# Patient Record
Sex: Female | Born: 1990 | Race: Black or African American | Hispanic: No | Marital: Single | Smoking: Never smoker
Health system: Southern US, Community
[De-identification: ages and names within clinical notes are randomized; demographics above are authoritative.]

## PROBLEM LIST (undated history)

## (undated) ENCOUNTER — Inpatient Hospital Stay (HOSPITAL_COMMUNITY): Payer: Self-pay

## (undated) DIAGNOSIS — Z789 Other specified health status: Secondary | ICD-10-CM

## (undated) DIAGNOSIS — B009 Herpesviral infection, unspecified: Secondary | ICD-10-CM

## (undated) HISTORY — PX: NO PAST SURGERIES: SHX2092

---
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
## 2011-05-10 ENCOUNTER — Emergency Department (HOSPITAL_COMMUNITY): Payer: Medicaid Other

## 2011-05-10 ENCOUNTER — Emergency Department (HOSPITAL_COMMUNITY)
Admission: EM | Admit: 2011-05-10 | Discharge: 2011-05-10 | Disposition: A | Discharge status: Home / Self Care | Payer: Medicaid Other | Attending: Emergency Medicine | Admitting: Emergency Medicine

## 2011-05-10 DIAGNOSIS — R51 Headache: Secondary | ICD-10-CM | POA: Insufficient documentation

## 2011-05-10 DIAGNOSIS — S022XXA Fracture of nasal bones, initial encounter for closed fracture: Secondary | ICD-10-CM | POA: Insufficient documentation

## 2011-05-10 DIAGNOSIS — R220 Localized swelling, mass and lump, head: Secondary | ICD-10-CM | POA: Insufficient documentation

## 2011-12-09 ENCOUNTER — Encounter: Payer: Self-pay | Admitting: Women's Health

## 2011-12-09 ENCOUNTER — Ambulatory Visit (INDEPENDENT_AMBULATORY_CARE_PROVIDER_SITE_OTHER): Payer: BC Managed Care – PPO | Admitting: Women's Health

## 2011-12-09 VITALS — BP 110/72 | Ht 61.5 in | Wt 159.0 lb

## 2011-12-09 DIAGNOSIS — Z01419 Encounter for gynecological examination (general) (routine) without abnormal findings: Secondary | ICD-10-CM

## 2011-12-09 DIAGNOSIS — Z113 Encounter for screening for infections with a predominantly sexual mode of transmission: Secondary | ICD-10-CM

## 2011-12-09 DIAGNOSIS — Z309 Encounter for contraceptive management, unspecified: Secondary | ICD-10-CM

## 2011-12-09 DIAGNOSIS — IMO0001 Reserved for inherently not codable concepts without codable children: Secondary | ICD-10-CM

## 2011-12-09 LAB — CBC WITH DIFFERENTIAL/PLATELET
Basophils Absolute: 0 10*3/uL (ref 0.0–0.1)
Basophils Relative: 0 % (ref 0–1)
Eosinophils Absolute: 0 10*3/uL (ref 0.0–0.7)
MCH: 27.1 pg (ref 26.0–34.0)
MCHC: 32.2 g/dL (ref 30.0–36.0)
Monocytes Absolute: 0.4 10*3/uL (ref 0.1–1.0)
Neutro Abs: 2.9 10*3/uL (ref 1.7–7.7)
Neutrophils Relative %: 54 % (ref 43–77)
RDW: 13.2 % (ref 11.5–15.5)

## 2011-12-09 MED ORDER — ETONOGESTREL-ETHINYL ESTRADIOL 0.12-0.015 MG/24HR VA RING: VAGINAL_RING | VAGINAL | Status: DC

## 2011-12-09 NOTE — Progress Notes (Signed)
Carly Petersen Apr 19, 1991 [id number]    History:    The patient presents for annual exam.  Monthly 7 day cycle/condoms. Use birth control pills in the past without a problem. Same partner one year. Gardasil series completed in 2009. Requesting contraception.   Past medical history, past surgical history, family history and social history were all reviewed and documented in the EPIC chart. Student  [employer] and works at [employer].   ROS:  A  ROS was performed and pertinent positives and negatives are included in the history.  Exam:  Filed Vitals:   12/09/11 1420  BP: 110/72    General appearance:  Normal Head/Neck:  Normal, without cervical or supraclavicular adenopathy. Thyroid:  Symmetrical, normal in size, without palpable masses or nodularity. Respiratory  Effort:  Normal  Auscultation:  Clear without wheezing or rhonchi Cardiovascular  Auscultation:  Regular rate, without rubs, murmurs or gallops  Edema/varicosities:  Not grossly evident Abdominal  Soft,nontender, without masses, guarding or rebound.  Liver/spleen:  No organomegaly noted  Hernia:  None appreciated  Skin  Inspection:  Grossly normal  Palpation:  Grossly normal Neurologic/psychiatric  Orientation:  Normal with appropriate conversation.  Mood/affect:  Normal  Genitourinary    Breasts: Examined lying and sitting.     Right: Without masses, retractions, discharge or axillary adenopathy.     Left: Without masses, retractions, discharge or axillary adenopathy.   Inguinal/mons:  Normal without inguinal adenopathy  External genitalia:  Normal  BUS/Urethra/Skene's glands:  Normal  Bladder:  Normal  Vagina:  Normal  Cervix:  Normal, menses  Uterus:   normal in size, shape and contour.  Midline and mobile  Adnexa/parametria:     Rt: Without masses or tenderness.   Lt: Without masses or tenderness.  Anus and perineum: Normal  Digital rectal exam:   Assessment/Plan:  21 y.o. SBF G0 for annual exam  requesting contraception.  Normal GYN exam Contraception counseling STD screening  Plan: Contraception options reviewed, nuva ring prescription, sample, proper use, slight risk for blood clots and strokes reviewed. Reviewed importance of condoms first month until permanent partner. SBE's, exercise, calcium rich diet, MVI daily, campus safety reviewed. Has gained approximately 20 pounds in 2 years with lifestyle changes, reviewed importance of decreasing calories, increasing exercise. CBC, GC/Chlamydia, HIV, RPR, hepatitis C, had hepatitis B series.    Harrington Challenger [facility], 5:33 PM 12/09/2011

## 2011-12-09 NOTE — Patient Instructions (Signed)
Health Maintenance, 18- to 21-Year-Old SCHOOL PERFORMANCE After high school completion, the Carly Petersen adult may be attending college, technical or vocational school, or entering the military or the work force. SOCIAL AND EMOTIONAL DEVELOPMENT The Carly Petersen adult establishes adult relationships and explores sexual identity. Carly Petersen adults may be living at home or in a college dorm or apartment. Increasing independence is important with Carly Petersen adults. Throughout adolescence, teens should assume responsibility of their own health care. IMMUNIZATIONS Most Carly Petersen adults should be fully vaccinated. A booster dose of Tdap (tetanus, diphtheria, and pertussis, or "whooping cough"), a dose of meningococcal vaccine to protect against a certain type of bacterial meningitis, hepatitis A, human papillomarvirus (HPV), chickenpox, or measles vaccines may be indicated, if not given at an earlier age. Annual influenza or "flu" vaccination should be considered during flu season.  TESTING Annual screening for vision and hearing problems is recommended. Vision should be screened objectively at least once between 18 and 21 years of age. The Carly Petersen adult may be screened for anemia or tuberculosis. Carly Petersen adults should have a blood test to check for high cholesterol during this time period. Carly Petersen adults should be screened for use of alcohol and drugs. If the Carly Petersen adult is sexually active, screening for sexually transmitted infections, pregnancy, or HIV may be performed. Screening for cervical cancer should be performed within 3 years of beginning sexual activity. NUTRITION AND ORAL HEALTH  Adequate calcium intake is important. Consume 3 servings of low-fat milk and dairy products daily. For those who do not drink milk or consume dairy products, calcium enriched foods, such as juice, bread, or cereal, dark, leafy greens, or canned fish are alternate sources of calcium.   Drink plenty of water. Limit fruit juice to 8 to 12 ounces per day.  Avoid sugary beverages or sodas.   Discourage skipping meals, especially breakfast. Teens should eat a good variety of vegetables and fruits, as well as lean meats.   Avoid high fat, high salt, and high sugar foods, such as candy, chips, and cookies.   Encourage Carly Petersen adults to participate in meal planning and preparation.   Eat meals together as a family whenever possible. Encourage conversation at mealtime.   Limit fast food choices and eating out at restaurants.   Brush teeth twice a day and floss.   Schedule dental exams twice a year.  SLEEP Regular sleep habits are important. PHYSICAL, SOCIAL, AND EMOTIONAL DEVELOPMENT  One hour of regular physical activity daily is recommended. Continue to participate in sports.   Encourage Carly Petersen adults to develop their own interests and consider community service or volunteerism.   Provide guidance to the Carly Petersen adult in making decisions about college and work plans.   Make sure that Carly Petersen adults know that they should never be in a situation that makes them uncomfortable, and they should tell partners if they do not want to engage in sexual activity.   Talk to the Carly Petersen adult about body image. Eating disorders may be noted at this time. Carly Petersen adults may also be concerned about being overweight. Monitor the Carly Petersen adult for weight gain or loss.   Mood disturbances, depression, anxiety, alcoholism, or attention problems may be noted in Carly Petersen adults. Talk to the caregiver if there are concerns about mental illness.   Negotiate limit setting and independent decision making.   Encourage the Carly Petersen adult to handle conflict without physical violence.   Avoid loud noises which may impair hearing.   Limit television and computer time to 2 hours per day.   Individuals who engage in excessive sedentary activity are more likely to become overweight.  RISK BEHAVIORS  Sexually active Carly Petersen adults need to take precautions against pregnancy and sexually  transmitted infections. Talk to Carly Petersen adults about contraception.   Provide a tobacco-free and drug-free environment for the Carly Petersen adult. Talk to the Carly Petersen adult about drug, tobacco, and alcohol use among friends or at friends' homes. Make sure the Carly Petersen adult knows that smoking tobacco or marijuana and taking drugs have health consequences and may impact brain development.   Teach the Carly Petersen adult about appropriate use of over-the-counter or prescription medicines.   Establish guidelines for driving and for riding with friends.   Talk to Carly Petersen adults about the risks of drinking and driving or boating. Encourage the Carly Petersen adult to call you if he or she or friends have been drinking or using drugs.   Remind Carly Petersen adults to wear seat belts at all times in cars and life vests in boats.   Carly Petersen adults should always wear a properly fitted helmet when they are riding a bicycle.   Use caution with all-terrain vehicles (ATVs) or other motorized vehicles.   Do not keep handguns in the home. (If you do, the gun and ammunition should be locked separately and out of the Carly Petersen adult's access.)   Equip your home with smoke detectors and change the batteries regularly. Make sure all family members know the fire escape plans for your home.   Teach Carly Petersen adults not to swim alone and not to dive in shallow water.   All individuals should wear sunscreen that protects against UVA and UVB light with at least a sun protection factor (SPF) of 30 when out in the sun. This minimizes sun burning.  WHAT'S NEXT? Carly Petersen adults should visit their pediatrician or family physician yearly. By Carly Petersen adulthood, health care should be transitioned to a family physician or internal medicine specialist. Sexually active females may want to begin annual physical exams with a gynecologist. Document Released: 11/04/2006 Document Revised: 07/29/2011 Document Reviewed: 11/24/2006 ExitCare Patient Information 2012 [employer]. 

## 2011-12-10 LAB — HEPATITIS C ANTIBODY: HCV Ab: NEGATIVE

## 2011-12-10 LAB — HIV ANTIBODY (ROUTINE TESTING W REFLEX): HIV: NONREACTIVE

## 2012-07-14 ENCOUNTER — Inpatient Hospital Stay (HOSPITAL_COMMUNITY)
Admission: AD | Admit: 2012-07-14 | Discharge: 2012-07-15 | Disposition: A | Discharge status: Home / Self Care | Payer: BC Managed Care – PPO | Source: Ambulatory Visit | Attending: Obstetrics & Gynecology | Admitting: Obstetrics & Gynecology

## 2012-07-14 DIAGNOSIS — O219 Vomiting of pregnancy, unspecified: Secondary | ICD-10-CM

## 2012-07-14 DIAGNOSIS — O210 Mild hyperemesis gravidarum: Secondary | ICD-10-CM | POA: Insufficient documentation

## 2012-07-14 HISTORY — DX: Other specified health status: Z78.9

## 2012-07-15 ENCOUNTER — Encounter (HOSPITAL_COMMUNITY): Payer: Self-pay | Admitting: *Deleted

## 2012-07-15 DIAGNOSIS — O219 Vomiting of pregnancy, unspecified: Secondary | ICD-10-CM

## 2012-07-15 LAB — URINALYSIS, ROUTINE W REFLEX MICROSCOPIC
Glucose, UA: NEGATIVE mg/dL
Ketones, ur: 15 mg/dL — AB
Leukocytes, UA: NEGATIVE
Nitrite: NEGATIVE
Specific Gravity, Urine: >1.03 — ABNORMAL HIGH (ref 1.005–1.030)
pH: 6 (ref 5.0–8.0)

## 2012-07-15 MED ORDER — PROMETHAZINE HCL 25 MG PO TABS: 25.0000 mg | ORAL_TABLET | Freq: Four times a day (QID) | ORAL | Status: DC | PRN

## 2012-07-15 MED ORDER — ONDANSETRON HCL 4 MG PO TABS: 4.0000 mg | ORAL_TABLET | Freq: Three times a day (TID) | ORAL | Status: DC | PRN

## 2012-07-15 NOTE — Progress Notes (Signed)
Written and verbal d/c instructions given and understanding voiced. 

## 2012-07-15 NOTE — MAU Provider Note (Signed)
  History     CSN: [id number]  Arrival date and time: 07/14/12 2354   None     Chief Complaint  Patient presents with  . Morning Sickness   HPI This is a 21 y.o. female at [redacted]w[redacted]d who presents with c/o nausea. States she vomits about twice a week. Has not started prenatal care yet and does not have any medicine. She did try Emetrol without success. Denies pain or bleeding.   RN Note: Pt LMP 05/18/2012, decreased appetite and nausea.      OB History    Grav Para Term Preterm Abortions TAB SAB Ect Mult Living   1               Past Medical History  Diagnosis Date  . No pertinent past medical history     Past Surgical History  Procedure Date  . No past surgeries     Family History  Problem Relation Age of Onset  . Diabetes Mother   . Hypertension Brother   . Diabetes Maternal Grandfather   . Hypertension Maternal Grandfather     History  Substance Use Topics  . Smoking status: Never Smoker   . Smokeless tobacco: Never Used  . Alcohol Use: No    Allergies: No Known Allergies  No prescriptions prior to admission    ROS See HPI  Physical Exam   Blood pressure 94/70, pulse 76, temperature 98.2 F (36.8 C), temperature source Oral, resp. rate 20, height 5\' 2"  (1.575 m), weight 162 lb 6.4 oz (73.664 kg), last menstrual period 05/18/2012.  Physical Exam  Constitutional: She is oriented to person, place, and time. She appears well-developed. No distress.  Cardiovascular: Normal rate.   Respiratory: Effort normal.  GI: Soft. She exhibits no distension and no mass. There is no tenderness. There is no rebound and no guarding.  Musculoskeletal: Normal range of motion.  Neurological: She is alert and oriented to person, place, and time.  Skin: Skin is warm and dry.  Psychiatric: She has a normal mood and affect.    MAU Course  Procedures  Assessment and Plan  A:  Pregnancy at [redacted]w[redacted]d      Nausea and vomiting of pregnancy  P:  Discharge      Rx Phenergan.  Also gave Rx for Zofran if unable to use Phenergan due to drowsiness. Warned about constipation.      Encouraged to seek [facility].  [facility] 07/15/2012, 1:48 AM

## 2012-07-15 NOTE — MAU Note (Signed)
Pt LMP 05/18/2012, decreased appetite and nausea.

## 2012-07-17 NOTE — MAU Provider Note (Signed)
Attestation of Attending Supervision of Advanced Practitioner (CNM/NP): Evaluation and management procedures were performed by the Advanced Practitioner under my supervision and collaboration.  I have reviewed the Advanced Practitioner's note and chart, and I agree with the management and plan.  Iyah Laguna, MD, FACOG Attending Obstetrician & Gynecologist Faculty Practice, [facility]  

## 2012-08-06 ENCOUNTER — Encounter (HOSPITAL_COMMUNITY): Payer: Self-pay | Admitting: Family

## 2012-08-06 ENCOUNTER — Encounter (HOSPITAL_COMMUNITY): Payer: Self-pay

## 2012-08-06 ENCOUNTER — Inpatient Hospital Stay (HOSPITAL_COMMUNITY)
Admission: AD | Admit: 2012-08-06 | Discharge: 2012-08-06 | Disposition: A | Discharge status: Home / Self Care | Payer: BC Managed Care – PPO | Source: Ambulatory Visit | Attending: Obstetrics & Gynecology | Admitting: Obstetrics & Gynecology

## 2012-08-06 DIAGNOSIS — E860 Dehydration: Secondary | ICD-10-CM

## 2012-08-06 DIAGNOSIS — O211 Hyperemesis gravidarum with metabolic disturbance: Secondary | ICD-10-CM

## 2012-08-06 LAB — URINALYSIS, ROUTINE W REFLEX MICROSCOPIC
Nitrite: NEGATIVE
Protein, ur: 100 mg/dL — AB
Specific Gravity, Urine: >1.03 — ABNORMAL HIGH (ref 1.005–1.030)
Urobilinogen, UA: 2 mg/dL — ABNORMAL HIGH (ref 0.0–1.0)

## 2012-08-06 LAB — OB RESULTS CONSOLE RPR: RPR: NONREACTIVE

## 2012-08-06 LAB — BASIC METABOLIC PANEL
BUN: 13 mg/dL (ref 6–23)
Chloride: 98 mEq/L (ref 96–112)
Creatinine, Ser: 0.66 mg/dL (ref 0.50–1.10)
GFR calc Af Amer: >90 mL/min (ref >90)
GFR calc non Af Amer: >90 mL/min (ref >90)

## 2012-08-06 LAB — URINE MICROSCOPIC-ADD ON

## 2012-08-06 LAB — CBC
HCT: 34.2 % — ABNORMAL LOW (ref 36.0–46.0)
MCHC: 33.9 g/dL (ref 30.0–36.0)
MCV: 80.7 fL (ref 78.0–100.0)
RDW: 12.1 % (ref 11.5–15.5)
WBC: 7 10*3/uL (ref 4.0–10.5)

## 2012-08-06 LAB — OB RESULTS CONSOLE RUBELLA ANTIBODY, IGM: Rubella: IMMUNE

## 2012-08-06 MED ORDER — PROMETHAZINE HCL 25 MG/ML IJ SOLN
25.0000 mg | Freq: Once | INTRAVENOUS | Status: AC
  Administered 2012-08-06: 25 mg via INTRAVENOUS
  Filled 2012-08-06: qty 1

## 2012-08-06 MED ORDER — METOCLOPRAMIDE HCL 5 MG PO TABS: 5.0000 mg | ORAL_TABLET | Freq: Four times a day (QID) | ORAL | Status: DC

## 2012-08-06 MED ORDER — ONDANSETRON 4 MG PO TBDP
4.0000 mg | ORAL_TABLET | Freq: Once | ORAL | Status: AC
  Administered 2012-08-06: 4 mg via ORAL
  Filled 2012-08-06: qty 1

## 2012-08-06 NOTE — MAU Provider Note (Signed)
Chief Complaint: Morning Sickness and Emesis During Pregnancy   First Provider Initiated Contact with Patient 08/06/12 1643     SUBJECTIVE HPI:  Carly Petersen is a 21 y.o. G1P0 at [redacted]w[redacted]d by LMP who is vomiting everything she attempts to eat. Has vomiting a few times today and has some associated epigastric cramping. Reports 16 pound weight loss since pregnancy began. At prior MAU visit received Phenergan and Zofran prescriptions. She is vomiting the Phenergan and did not fill the Zofran due to concern about constipation. Denies dysuria. No lower abd cramping. No bleeding. NPC, has applied MC.   Past Medical History  Diagnosis Date  . No pertinent past medical history    OB History    Grav Para Term Preterm Abortions TAB SAB Ect Mult Living   1              # Outc Date GA Lbr Len/2nd Wgt Sex Del Anes PTL Lv   1 CUR              Past Surgical History  Procedure Date  . No past surgeries    History   Social History  . Marital Status: Single    Spouse Name: N/A    Number of Children: N/A  . Years of Education: N/A   Occupational History  . Not on file.   Social History Main Topics  . Smoking status: Never Smoker   . Smokeless tobacco: Never Used  . Alcohol Use: No  . Drug Use: No  . Sexually Active: Yes    Birth Control/ Protection: None   Other Topics Concern  . Not on file   Social History Narrative  . No narrative on file   No current facility-administered medications on file prior to encounter.   Current Outpatient Prescriptions on File Prior to Encounter  Medication Sig Dispense Refill  . promethazine (PHENERGAN) 25 MG tablet Take 1 tablet (25 mg total) by mouth every 6 (six) hours as needed for nausea.  30 tablet  0   No Known Allergies  ROS: Pertinent items in HPI  OBJECTIVE Blood pressure 116/76, pulse 101, temperature 97.7 F (36.5 C), temperature source Oral, resp. rate 18, height 5\' 2"  (1.575 m), weight 150 lb 9.6 oz (68.312 kg), last menstrual  period 05/18/2012. GENERAL: Well-developed, well-nourished female in no acute distress.  HEENT: Normocephalic HEART: normal rate RESP: normal effort ABDOMEN: Soft, non-tender; S=D. DT 160 EXTREMITIES: Nontender, no edema NEURO: Alert and oriented LAB RESULTS No results found for this or any previous visit (from the past 24 hour(s)). Results for orders placed during the hospital encounter of 08/06/12 (from the past 48 hour(s))  URINALYSIS, ROUTINE W REFLEX MICROSCOPIC     Status: Abnormal   Collection Time   08/06/12  4:15 PM      Component Value Range Comment   Color, Urine YELLOW  YELLOW    APPearance CLEAR  CLEAR    Specific Gravity, Urine >1.030 (*) 1.005 - 1.030    pH 6.0  5.0 - 8.0    Glucose, UA NEGATIVE  NEGATIVE mg/dL    Hgb urine dipstick TRACE (*) NEGATIVE    Bilirubin Urine SMALL (*) NEGATIVE    Ketones, ur >80 (*) NEGATIVE mg/dL    Protein, ur 098 (*) NEGATIVE mg/dL    Urobilinogen, UA 2.0 (*) 0.0 - 1.0 mg/dL    Nitrite NEGATIVE  NEGATIVE    Leukocytes, UA TRACE (*) NEGATIVE   URINE MICROSCOPIC-ADD ON     Status:  Abnormal   Collection Time   08/06/12  4:15 PM      Component Value Range Comment   Squamous Epithelial / LPF RARE  RARE    WBC, UA 3-6  <3 WBC/hpf    Bacteria, UA FEW (*) RARE    Urine-Other MUCOUS PRESENT     CBC     Status: Abnormal   Collection Time   08/06/12  5:10 PM      Component Value Range Comment   WBC 7.0  4.0 - 10.5 K/uL    RBC 4.24  3.87 - 5.11 MIL/uL    Hemoglobin 11.6 (*) 12.0 - 15.0 g/dL    HCT 16.1 (*) 09.6 - 46.0 %    MCV 80.7  78.0 - 100.0 fL    MCH 27.4  26.0 - 34.0 pg    MCHC 33.9  30.0 - 36.0 g/dL    RDW 04.5  40.9 - 81.1 %    Platelets 179  150 - 400 K/uL   BASIC METABOLIC PANEL     Status: Abnormal   Collection Time   08/06/12  5:10 PM      Component Value Range Comment   Sodium 132 (*) 135 - 145 mEq/L    Potassium 3.6  3.5 - 5.1 mEq/L    Chloride 98  96 - 112 mEq/L    CO2 20  19 - 32 mEq/L    Glucose, Bld 72  70 -  99 mg/dL    BUN 13  6 - 23 mg/dL    Creatinine, Ser 9.14  0.50 - 1.10 mg/dL    Calcium 9.3  8.4 - 78.2 mg/dL    GFR calc non Af Amer >90  >90 mL/min    GFR calc Af Amer >90  >90 mL/min    IMAGING No results found.  MAU COURSE  ASSESSMENT 1. Hyperemesis gravidarum before end of [redacted] week gestation, dehydration   2. Dehydration     PLAN Discharge home AVS on Hyperemesis Follow-up Information    Schedule an appointment as soon as possible for a visit with [facility] [place]. (See list of providers below)    Contact information:   [address] [place] [address] [phone]          Medication List     As of 08/07/2012  8:57 PM    TAKE these medications         calcium carbonate 500 MG chewable tablet   Commonly known as: TUMS - dosed in mg elemental calcium   Chew 1 tablet by mouth daily as needed. indigestion      metoCLOPramide 5 MG tablet   Commonly known as: REGLAN   Take 1 tablet (5 mg total) by mouth 4 (four) times daily.      prenatal multivitamin Tabs   Take 1 tablet by mouth daily.      promethazine 25 MG tablet   Commonly known as: PHENERGAN   Take 1 tablet (25 mg total) by mouth every 6 (six) hours as needed for nausea.          Danae Orleans, CNM 08/06/2012  4:44 PM

## 2012-08-06 NOTE — MAU Note (Addendum)
Pt reports having N/V. Not able to keep anything down. Using phenergan with out much relief. Pt reports having some dizzy spells as well

## 2012-08-06 NOTE — MAU Note (Signed)
Patient reports started feeling dizzy on Friday; has not been able to keep a meal on stomach since Friday. Tolerating sips and crackers. Has lost 16 lbs in last 6 weeks because throwing up and no appetite.

## 2012-08-08 LAB — URINE CULTURE: Culture: NO GROWTH

## 2012-08-08 NOTE — MAU Provider Note (Signed)
Attestation of Attending Supervision of Advanced Practitioner (PA/CNM/NP): Evaluation and management procedures were performed by the Advanced Practitioner under my supervision and collaboration.  I have reviewed the Advanced Practitioner's note and chart, and I agree with the management and plan.  Bayler Nehring, MD, FACOG Attending Obstetrician & Gynecologist Faculty Practice, [facility]  

## 2012-08-14 LAB — OB RESULTS CONSOLE GC/CHLAMYDIA
Chlamydia: NEGATIVE
Gonorrhea: NEGATIVE

## 2012-08-23 NOTE — L&D Delivery Note (Signed)
Delivery Note With 2 pushes for delivery, at 10:29 AM a viable and healthy female was delivered via Vaginal, Spontaneous Delivery (Presentation: ; Occiput Anterior).  APGAR: 9, 9; weight P.   Placenta status: Intact, Spontaneous.  Cord: 3 vessels with the following complications: None.    Anesthesia: Epidural  Episiotomy: None Lacerations: 2nd degree Suture Repair: 3.0 vicryl rapide Est. Blood Loss (mL): 400cc  Mom to postpartum.  Baby to stay with mom and family.  BOVARD,Owenn Rothermel 02/14/2013, 10:53 AM  B+/Br/Contra?Rachelle Hora

## 2012-11-28 LAB — OB RESULTS CONSOLE RPR: RPR: NONREACTIVE

## 2013-01-12 ENCOUNTER — Encounter (HOSPITAL_COMMUNITY): Payer: Self-pay | Admitting: *Deleted

## 2013-01-12 ENCOUNTER — Inpatient Hospital Stay (HOSPITAL_COMMUNITY)
Admission: AD | Admit: 2013-01-12 | Discharge: 2013-01-12 | Disposition: A | Discharge status: Home / Self Care | Payer: No Typology Code available for payment source | Source: Ambulatory Visit | Attending: Obstetrics and Gynecology | Admitting: Obstetrics and Gynecology

## 2013-01-12 DIAGNOSIS — M542 Cervicalgia: Secondary | ICD-10-CM | POA: Insufficient documentation

## 2013-01-12 DIAGNOSIS — M549 Dorsalgia, unspecified: Secondary | ICD-10-CM

## 2013-01-12 DIAGNOSIS — O4700 False labor before 37 completed weeks of gestation, unspecified trimester: Secondary | ICD-10-CM

## 2013-01-12 DIAGNOSIS — Y92410 Unspecified street and highway as the place of occurrence of the external cause: Secondary | ICD-10-CM | POA: Insufficient documentation

## 2013-01-12 LAB — URINALYSIS, ROUTINE W REFLEX MICROSCOPIC
Bilirubin Urine: NEGATIVE
Ketones, ur: 15 mg/dL — AB
Nitrite: NEGATIVE
pH: 6 (ref 5.0–8.0)

## 2013-01-12 MED ORDER — CYCLOBENZAPRINE HCL 10 MG PO TABS
10.0000 mg | ORAL_TABLET | Freq: Once | ORAL | Status: AC
  Administered 2013-01-12: 10 mg via ORAL
  Filled 2013-01-12: qty 1

## 2013-01-12 MED ORDER — CYCLOBENZAPRINE HCL 10 MG PO TABS: 10.0000 mg | ORAL_TABLET | Freq: Two times a day (BID) | ORAL | Status: DC | PRN

## 2013-01-12 MED ORDER — ACETAMINOPHEN 500 MG PO TABS
1000.0000 mg | ORAL_TABLET | Freq: Once | ORAL | Status: AC
  Administered 2013-01-12: 1000 mg via ORAL
  Filled 2013-01-12: qty 2
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
## 2013-02-07 ENCOUNTER — Encounter (HOSPITAL_COMMUNITY): Payer: Self-pay | Admitting: *Deleted

## 2013-02-07 ENCOUNTER — Inpatient Hospital Stay (HOSPITAL_COMMUNITY)
Admission: AD | Admit: 2013-02-07 | Discharge: 2013-02-07 | Disposition: A | Discharge status: Home / Self Care | Payer: BC Managed Care – PPO | Source: Ambulatory Visit | Attending: Obstetrics and Gynecology | Admitting: Obstetrics and Gynecology

## 2013-02-07 DIAGNOSIS — O479 False labor, unspecified: Secondary | ICD-10-CM | POA: Insufficient documentation

## 2013-02-07 NOTE — MAU Note (Signed)
Contractions x 3 hours 3-7 minutes apart, denies bleeding or ROM.

## 2013-02-08 NOTE — Progress Notes (Signed)
FHT from 6-18 reviewed.  Reactive NST, no decels, irreg ctx.

## 2013-02-13 ENCOUNTER — Encounter (HOSPITAL_COMMUNITY): Payer: Self-pay | Admitting: *Deleted

## 2013-02-13 ENCOUNTER — Inpatient Hospital Stay (HOSPITAL_COMMUNITY)
Admission: AD | Admit: 2013-02-13 | Discharge: 2013-02-16 | DRG: 372 | Disposition: A | Discharge status: Home / Self Care | Payer: BC Managed Care – PPO | Source: Ambulatory Visit | Attending: Obstetrics and Gynecology | Admitting: Obstetrics and Gynecology

## 2013-02-13 DIAGNOSIS — O4290 Premature rupture of membranes, unspecified as to length of time between rupture and onset of labor, unspecified weeks of gestation: Secondary | ICD-10-CM

## 2013-02-13 DIAGNOSIS — O99892 Other specified diseases and conditions complicating childbirth: Secondary | ICD-10-CM | POA: Diagnosis present

## 2013-02-13 DIAGNOSIS — O98519 Other viral diseases complicating pregnancy, unspecified trimester: Secondary | ICD-10-CM | POA: Diagnosis present

## 2013-02-13 DIAGNOSIS — A6000 Herpesviral infection of urogenital system, unspecified: Secondary | ICD-10-CM | POA: Diagnosis present

## 2013-02-13 DIAGNOSIS — O365990 Maternal care for other known or suspected poor fetal growth, unspecified trimester, not applicable or unspecified: Secondary | ICD-10-CM | POA: Diagnosis present

## 2013-02-13 DIAGNOSIS — Z22330 Carrier of Group B streptococcus: Secondary | ICD-10-CM

## 2013-02-13 HISTORY — DX: Herpesviral infection, unspecified: B00.9

## 2013-02-13 LAB — CBC
HCT: 35.5 % — ABNORMAL LOW (ref 36.0–46.0)
Hemoglobin: 12 g/dL (ref 12.0–15.0)
RDW: 14.3 % (ref 11.5–15.5)
WBC: 10.6 10*3/uL — ABNORMAL HIGH (ref 4.0–10.5)

## 2013-02-13 LAB — OB RESULTS CONSOLE GBS: GBS: POSITIVE

## 2013-02-13 LAB — AMNISURE RUPTURE OF MEMBRANE (ROM) NOT AT ARMC: Amnisure ROM: POSITIVE

## 2013-02-13 LAB — POCT FERN TEST: POCT Fern Test: NEGATIVE

## 2013-02-13 MED ORDER — OXYTOCIN 40 UNITS IN LACTATED RINGERS INFUSION - SIMPLE MED: 62.5000 mL/h | INTRAVENOUS | Status: DC

## 2013-02-13 MED ORDER — PENICILLIN G POTASSIUM 5000000 UNITS IJ SOLR
5.0000 10*6.[IU] | Freq: Once | INTRAVENOUS | Status: AC
  Administered 2013-02-13: 5 10*6.[IU] via INTRAVENOUS
  Filled 2013-02-13: qty 5

## 2013-02-13 MED ORDER — LIDOCAINE HCL (PF) 1 % IJ SOLN
30.0000 mL | INTRAMUSCULAR | Status: DC | PRN
  Filled 2013-02-13: qty 30

## 2013-02-13 MED ORDER — OXYTOCIN BOLUS FROM INFUSION: 500.0000 mL | INTRAVENOUS | Status: DC

## 2013-02-13 MED ORDER — ACETAMINOPHEN 325 MG PO TABS: 650.0000 mg | ORAL_TABLET | ORAL | Status: DC | PRN

## 2013-02-13 MED ORDER — ONDANSETRON HCL 4 MG/2ML IJ SOLN: 4.0000 mg | Freq: Four times a day (QID) | INTRAMUSCULAR | Status: DC | PRN

## 2013-02-13 MED ORDER — TERBUTALINE SULFATE 1 MG/ML IJ SOLN: 0.2500 mg | Freq: Once | INTRAMUSCULAR | Status: AC | PRN

## 2013-02-13 MED ORDER — IBUPROFEN 600 MG PO TABS
600.0000 mg | ORAL_TABLET | Freq: Four times a day (QID) | ORAL | Status: DC | PRN
  Administered 2013-02-14: 600 mg via ORAL
  Filled 2013-02-13: qty 1

## 2013-02-13 MED ORDER — FLEET ENEMA 7-19 GM/118ML RE ENEM: 1.0000 | ENEMA | RECTAL | Status: DC | PRN

## 2013-02-13 MED ORDER — OXYTOCIN 40 UNITS IN LACTATED RINGERS INFUSION - SIMPLE MED
1.0000 m[IU]/min | INTRAVENOUS | Status: DC
  Administered 2013-02-13: 1 m[IU]/min via INTRAVENOUS
  Filled 2013-02-13: qty 1000

## 2013-02-13 MED ORDER — OXYCODONE-ACETAMINOPHEN 5-325 MG PO TABS: 1.0000 | ORAL_TABLET | ORAL | Status: DC | PRN

## 2013-02-13 MED ORDER — LACTATED RINGERS IV SOLN
INTRAVENOUS | Status: DC
  Administered 2013-02-13 – 2013-02-14 (×2): via INTRAVENOUS

## 2013-02-13 MED ORDER — CITRIC ACID-SODIUM CITRATE 334-500 MG/5ML PO SOLN: 30.0000 mL | ORAL | Status: DC | PRN

## 2013-02-13 MED ORDER — LACTATED RINGERS IV SOLN: 500.0000 mL | INTRAVENOUS | Status: DC | PRN

## 2013-02-13 MED ORDER — PENICILLIN G POTASSIUM 5000000 UNITS IJ SOLR
2.5000 10*6.[IU] | INTRAVENOUS | Status: DC
  Administered 2013-02-14 (×2): 2.5 10*6.[IU] via INTRAVENOUS
  Filled 2013-02-13 (×7): qty 2.5

## 2013-02-13 MED ORDER — BUTORPHANOL TARTRATE 1 MG/ML IJ SOLN: 1.0000 mg | INTRAMUSCULAR | Status: DC | PRN

## 2013-02-13 NOTE — H&P (Signed)
Carly Petersen is a 22 y.o. female G1P0 at 73 5/7 weeks (EDD 02/22/13 by 11 week [place]) presenting for SROM at about 1900pm.  Pt reports 2 gushes of fluid and is now having mild contractions.  Her fern was negative, but amniosure positive.  Prenatal care significant for +GBS in urine first trimester.  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  She measured S<D at 32 weeks and US showed baby at the 19%ile.  A followup US 3 weeks later showed the baby at the less than 10%ile with possible IUGR.  She was then followed closely with NST's and AFI checks, which have remained reassuring.  Maternal Medical History:  Reason for admission: Rupture of membranes.   Contractions: Onset was 3-5 hours ago.   Frequency: regular.   Perceived severity is mild.    Fetal activity: Perceived fetal activity is normal.    Prenatal complications: IUGR.   Prenatal Complications - Diabetes: none.    OB History   Grav Para Term Preterm Abortions TAB SAB Ect Mult Living   1 0 0 0 0 0 0 0 0 0      Past Medical History  Diagnosis Date  . No pertinent past medical history   . HSV infection     last outbreak in the middle of the pregnancy   Past Surgical History  Procedure Laterality Date  . No past surgeries     Family History: family history includes Diabetes in her maternal grandfather and mother and Hypertension in her brother and maternal grandfather. Social History:  reports that she has never smoked. She has never used smokeless tobacco. She reports that she does not drink alcohol or use illicit drugs.   Prenatal Transfer Tool  Maternal Diabetes: No Genetic Screening: Normal Maternal Ultrasounds/Referrals: Abnormal:  Findings:   IUGR Fetal Ultrasounds or other Referrals:  None Maternal Substance Abuse:  No Significant Maternal Medications:  Meds include: Other: Valtrex Significant Maternal Lab Results:  Lab values include: Group B Strep  positive Other Comments:  None  ROS  Dilation: 3 Effacement (%): 70 Station: -2 Exam by:: Rwanda Smith CNM Blood pressure 117/70, pulse 93, temperature 98 F (36.7 C), temperature source Oral, resp. rate 20, height 5\' 1"  (1.549 m), weight 77.735 kg (171 lb 6 oz), last menstrual period 05/18/2012. Maternal Exam:  Uterine Assessment: Contraction strength is mild.  Contraction frequency is regular.   Abdomen: Patient reports no abdominal tenderness. Fetal presentation: vertex  Introitus: Normal vulva. Normal vagina.  Amniotic fluid character: clear.     Physical Exam  Constitutional: She is oriented to person, place, and time. She appears well-developed and well-nourished.  Cardiovascular: Normal rate and regular rhythm.   Respiratory: Effort normal and breath sounds normal.  GI: Soft. Bowel sounds are normal.  Genitourinary:  Two small follicular-appearing  bumps on buttock--not vesicular  Neurological: She is alert and oriented to person, place, and time.  Psychiatric: She has a normal mood and affect. Her behavior is normal.    Prenatal labs: ABO, Rh:  B positive Antibody:  negative Rubella:  Immune RPR:   Negative HBsAg:   Negative HIV:   Negative GBS: Positive (06/24 0000)  First trimester screen and AFP WNL Hgb AA CF negative One hour GTT 98  Assessment/Plan:Pt admitted with SROM confirmed by amniosure.  Some regular contractions but still mild.  Will augment with pitocin and watch FHR closely given possible IUGR.  PCN for +GBS.  Lesions on buttock do not look like HSV but will cover with tegaderm to be safe.   Oliver Pila 02/13/2013, 10:40 PM

## 2013-02-13 NOTE — MAU Provider Note (Signed)
Chief Complaint:  No chief complaint on file.  First Provider Initiated Contact with Patient 02/13/13 2055     HPI: Carly Petersen is a 22 y.o. G1P0000 at [redacted]w[redacted]d who presents to maternity admissions reporting leaking clear fluid through her jeans at 1900. Small amount since then. Mild-moderate contractions. Denies HSV outbreak or prodrome or vaginal bleeding. Good fetal movement.   Pregnancy Course: antenatal testing for S>D  Past Medical History: Past Medical History  Diagnosis Date  . No pertinent past medical history   . HSV infection     last outbreak in the middle of the pregnancy    Past obstetric history: OB History   Grav Para Term Preterm Abortions TAB SAB Ect Mult Living   1 0 0 0 0 0 0 0 0 0      # Outc Date GA Lbr Len/2nd Wgt Sex Del Anes PTL Lv   1 CUR               Past Surgical History: Past Surgical History  Procedure Laterality Date  . No past surgeries      Family History: Family History  Problem Relation Age of Onset  . Diabetes Mother   . Hypertension Brother   . Diabetes Maternal Grandfather   . Hypertension Maternal Grandfather     Social History: History  Substance Use Topics  . Smoking status: Never Smoker   . Smokeless tobacco: Never Used  . Alcohol Use: No    Allergies: No Known Allergies  Meds:  Prescriptions prior to admission  Medication Sig Dispense Refill  . alum & mag hydroxide-simeth (MAALOX/MYLANTA) 200-200-20 MG/5ML suspension Take 10 mLs by mouth every 6 (six) hours as needed for indigestion.      . Multiple Vitamins-Minerals (ADULT GUMMY PO) Take 2 each by mouth daily.      . valACYclovir (VALTREX) 500 MG tablet Take 500 mg by mouth 2 (two) times daily.        ROS: Pertinent findings in history of present illness.  Physical Exam  Blood pressure 117/70, pulse 93, temperature 98 F (36.7 C), temperature source Oral, resp. rate 20, height 5\' 1"  (1.549 m), weight 77.735 kg (171 lb 6 oz), last menstrual period  05/18/2012. GENERAL: Well-developed, well-nourished female in no acute distress.  HEENT: normocephalic HEART: normal rate RESP: normal effort ABDOMEN: Soft, non-tender, gravid appropriate for gestational age EXTREMITIES: Nontender, no edema NEURO: alert and oriented SPECULUM EXAM: NEFG except for two firm, 3 mm dark, healed lesions on left buttock. Moderate amount of thin, watery mucus. Scant bloody show. No vaginal or cervical lesions.   Dilation: 3 Effacement (%): 70 Cervical Position: Middle Station: -2 Presentation: Vertex Exam by:: Dorathy Kinsman CNM  FHT:  Baseline 130 , moderate variability, accelerations present, no decelerations Contractions: q 3-5 mins, mild-mod   Labs: Results for orders placed during the hospital encounter of 02/13/13 (from the past 24 hour(s))  AMNISURE RUPTURE OF MEMBRANE (ROM)     Status: None   Collection Time    02/13/13  9:18 PM      Result Value Range   Amnisure ROM POSITIVE    POCT FERN TEST     Status: None   Collection Time    02/13/13  9:22 PM      Result Value Range   POCT Fern Test Negative = intact amniotic membranes      Plan: Assessment: 1. Labor: prodromal, PROM 2. Fetal Wellbeing: Category I  3. Pain Control: None 4. GBS: pos in urine  5. 38.5 week IUP 6. S<D 7. Hx HSV on Valtrex. Well-healed lesions on buttocks w/ los suspicion for HSV  Plan:  1. Admit to BS per consult with Dr. Senaida Ores. 2. Routine L&D orders 3. Analgesia/anesthesia PRN  4. PCN 5. Apply Tegaderm to lesions. 6. Pitocin augmentation.  [place], [place] 02/13/2013 10:23 PM

## 2013-02-13 NOTE — MAU Note (Signed)
PT SAYS SHE WAS ON PHONE-  SITIING IN CHAIR  - SHE FELT WET-  WENT TO B-ROOM -  PANTS WET- AND WAS CHANGING -   THEN SAW MORE FLUID.     YESTERDAY-  IN OFFICE - DR MEISINGER-  VE 3 CM,     PT SAYS HAS HX OF HSV-  TAKING VALTREX--  PT DENIES OUTBREAK-    LAST  OUTBREAK-  WAS IN MIDDLE OF PREG.   DENIES MRSA.

## 2013-02-14 ENCOUNTER — Encounter (HOSPITAL_COMMUNITY): Payer: Self-pay | Admitting: Anesthesiology

## 2013-02-14 ENCOUNTER — Inpatient Hospital Stay (HOSPITAL_COMMUNITY): Payer: BC Managed Care – PPO | Admitting: Anesthesiology

## 2013-02-14 LAB — ABO/RH: ABO/RH(D): B POS

## 2013-02-14 LAB — RPR: RPR Ser Ql: NONREACTIVE

## 2013-02-14 LAB — TYPE AND SCREEN
ABO/RH(D): B POS
Antibody Screen: NEGATIVE

## 2013-02-14 MED ORDER — OXYCODONE-ACETAMINOPHEN 5-325 MG PO TABS
1.0000 | ORAL_TABLET | ORAL | Status: DC | PRN
  Administered 2013-02-14: 1 via ORAL
  Administered 2013-02-14: 2 via ORAL
  Administered 2013-02-15 – 2013-02-16 (×3): 1 via ORAL
  Filled 2013-02-14: qty 2
  Filled 2013-02-14 (×4): qty 1

## 2013-02-14 MED ORDER — EPHEDRINE 5 MG/ML INJ
10.0000 mg | INTRAVENOUS | Status: DC | PRN
  Filled 2013-02-14: qty 2

## 2013-02-14 MED ORDER — ZOLPIDEM TARTRATE 5 MG PO TABS: 5.0000 mg | ORAL_TABLET | Freq: Every evening | ORAL | Status: DC | PRN

## 2013-02-14 MED ORDER — PHENYLEPHRINE 40 MCG/ML (10ML) SYRINGE FOR IV PUSH (FOR BLOOD PRESSURE SUPPORT)
80.0000 ug | PREFILLED_SYRINGE | INTRAVENOUS | Status: DC | PRN
  Filled 2013-02-14: qty 5
  Filled 2013-02-14: qty 2

## 2013-02-14 MED ORDER — BENZOCAINE-MENTHOL 20-0.5 % EX AERO
1.0000 "application " | INHALATION_SPRAY | CUTANEOUS | Status: DC | PRN
  Filled 2013-02-14: qty 56

## 2013-02-14 MED ORDER — LACTATED RINGERS IV SOLN: INTRAVENOUS | Status: DC

## 2013-02-14 MED ORDER — IBUPROFEN 600 MG PO TABS
600.0000 mg | ORAL_TABLET | Freq: Four times a day (QID) | ORAL | Status: DC
  Administered 2013-02-14 – 2013-02-16 (×6): 600 mg via ORAL
  Filled 2013-02-14 (×6): qty 1

## 2013-02-14 MED ORDER — DIPHENHYDRAMINE HCL 50 MG/ML IJ SOLN: 12.5000 mg | INTRAMUSCULAR | Status: DC | PRN

## 2013-02-14 MED ORDER — FENTANYL 2.5 MCG/ML BUPIVACAINE 1/10 % EPIDURAL INFUSION (WH - ANES)
INTRAMUSCULAR | Status: DC | PRN
  Administered 2013-02-14: 13 mL/h via EPIDURAL

## 2013-02-14 MED ORDER — ONDANSETRON HCL 4 MG/2ML IJ SOLN: 4.0000 mg | INTRAMUSCULAR | Status: DC | PRN

## 2013-02-14 MED ORDER — LACTATED RINGERS IV SOLN
500.0000 mL | Freq: Once | INTRAVENOUS | Status: AC
  Administered 2013-02-14: 08:00:00 via INTRAVENOUS

## 2013-02-14 MED ORDER — DIBUCAINE 1 % RE OINT: 1.0000 "application " | TOPICAL_OINTMENT | RECTAL | Status: DC | PRN

## 2013-02-14 MED ORDER — LIDOCAINE HCL (PF) 1 % IJ SOLN
INTRAMUSCULAR | Status: DC | PRN
  Administered 2013-02-14: 3 mL
  Administered 2013-02-14: 4 mL

## 2013-02-14 MED ORDER — PHENYLEPHRINE 40 MCG/ML (10ML) SYRINGE FOR IV PUSH (FOR BLOOD PRESSURE SUPPORT)
80.0000 ug | PREFILLED_SYRINGE | INTRAVENOUS | Status: DC | PRN
  Filled 2013-02-14: qty 2

## 2013-02-14 MED ORDER — WITCH HAZEL-GLYCERIN EX PADS: 1.0000 "application " | MEDICATED_PAD | CUTANEOUS | Status: DC | PRN

## 2013-02-14 MED ORDER — FENTANYL 2.5 MCG/ML BUPIVACAINE 1/10 % EPIDURAL INFUSION (WH - ANES)
14.0000 mL/h | INTRAMUSCULAR | Status: DC | PRN
  Filled 2013-02-14: qty 125

## 2013-02-14 MED ORDER — SENNOSIDES-DOCUSATE SODIUM 8.6-50 MG PO TABS
2.0000 | ORAL_TABLET | Freq: Every day | ORAL | Status: DC
  Administered 2013-02-14 – 2013-02-15 (×2): 2 via ORAL

## 2013-02-14 MED ORDER — EPHEDRINE 5 MG/ML INJ
10.0000 mg | INTRAVENOUS | Status: DC | PRN
  Filled 2013-02-14: qty 4
  Filled 2013-02-14: qty 2

## 2013-02-14 MED ORDER — TETANUS-DIPHTH-ACELL PERTUSSIS 5-2.5-18.5 LF-MCG/0.5 IM SUSP
0.5000 mL | Freq: Once | INTRAMUSCULAR | Status: AC
  Administered 2013-02-14: 0.5 mL via INTRAMUSCULAR

## 2013-02-14 MED ORDER — DIPHENHYDRAMINE HCL 25 MG PO CAPS: 25.0000 mg | ORAL_CAPSULE | Freq: Four times a day (QID) | ORAL | Status: DC | PRN

## 2013-02-14 MED ORDER — PRENATAL MULTIVITAMIN CH
1.0000 | ORAL_TABLET | Freq: Every day | ORAL | Status: DC
  Administered 2013-02-15: 1 via ORAL
  Filled 2013-02-14: qty 1

## 2013-02-14 MED ORDER — SIMETHICONE 80 MG PO CHEW: 80.0000 mg | CHEWABLE_TABLET | ORAL | Status: DC | PRN

## 2013-02-14 MED ORDER — LANOLIN HYDROUS EX OINT: TOPICAL_OINTMENT | CUTANEOUS | Status: DC | PRN

## 2013-02-14 MED ORDER — ONDANSETRON HCL 4 MG PO TABS: 4.0000 mg | ORAL_TABLET | ORAL | Status: DC | PRN

## 2013-02-14 NOTE — Progress Notes (Signed)
Patient ID: Carly Petersen, female   DOB: 22-Nov-1990, 21 y.o.   MRN: [id number]  Pt very uncomfortable with ctx, just had epidural placed.  AFVSS gen uncomf FHTs 120's, mod var toco irr q 2-60min  5cm per RN  Continue current mgmt, pitocin for IOL

## 2013-02-14 NOTE — Anesthesia Postprocedure Evaluation (Signed)
  Anesthesia Post-op Note  Patient: Carly Petersen  Procedure(s) Performed: * No procedures listed *  Patient Location: PACU and Mother/Baby  Anesthesia Type:Epidural  Level of Consciousness: awake  Airway and Oxygen Therapy: Patient Spontanous Breathing  Post-op Pain: mild  Post-op Assessment: Patient's Cardiovascular Status Stable and Respiratory Function Stable  Post-op Vital Signs: stable  Complications: No apparent anesthesia complications

## 2013-02-14 NOTE — Anesthesia Preprocedure Evaluation (Signed)
Anesthesia Evaluation  Patient identified by MRN, date of birth, ID band Patient awake    Reviewed: Allergy & Precautions, H&P , NPO status , Patient's Chart, lab work & pertinent test results  Airway Mallampati: III TM Distance: >3 FB Neck ROM: Full    Dental no notable dental hx. (+) Teeth Intact   Pulmonary neg pulmonary ROS,  breath sounds clear to auscultation  Pulmonary exam normal       Cardiovascular negative cardio ROS  Rhythm:Regular Rate:Normal     Neuro/Psych negative neurological ROS  negative psych ROS   GI/Hepatic Neg liver ROS, GERD-  Medicated and Controlled,  Endo/Other  negative endocrine ROS  Renal/GU negative Renal ROS  negative genitourinary   Musculoskeletal negative musculoskeletal ROS (+)   Abdominal   Peds  Hematology negative hematology ROS (+)   Anesthesia Other Findings   Reproductive/Obstetrics HSV IUP Term                           Anesthesia Physical Anesthesia Plan  ASA: II  Anesthesia Plan: Epidural   Post-op Pain Management:    Induction:   Airway Management Planned: Natural Airway  Additional Equipment:   Intra-op Plan:   Post-operative Plan:   Informed Consent: I have reviewed the patients History and Physical, chart, labs and discussed the procedure including the risks, benefits and alternatives for the proposed anesthesia with the patient or authorized representative who has indicated his/her understanding and acceptance.   Dental advisory given  Plan Discussed with: Anesthesiologist  Anesthesia Plan Comments:         Anesthesia Quick Evaluation

## 2013-02-14 NOTE — Anesthesia Procedure Notes (Signed)
Epidural Patient location during procedure: OB Start time: 02/14/2013 8:28 AM  Staffing Anesthesiologist: Rafia Shedden A. Performed by: anesthesiologist   Preanesthetic Checklist Completed: patient identified, site marked, surgical consent, pre-op evaluation, timeout performed, IV checked, risks and benefits discussed and monitors and equipment checked  Epidural Patient position: sitting Prep: site prepped and draped and DuraPrep Patient monitoring: continuous pulse ox and blood pressure Approach: midline Injection technique: LOR air  Needle:  Needle type: Tuohy  Needle gauge: 17 G Needle length: 9 cm and 9 Needle insertion depth: 5 cm cm Catheter type: closed end flexible Catheter size: 19 Gauge Catheter at skin depth: 10 cm Test dose: negative and Other  Assessment Events: blood not aspirated, injection not painful, no injection resistance, negative IV test and no paresthesia  Additional Notes Patient identified. Risks and benefits discussed including failed block, incomplete  Pain control, post dural puncture headache, nerve damage, paralysis, blood pressure Changes, nausea, vomiting, reactions to medications-both toxic and allergic and post Partum back pain. All questions were answered. Patient expressed understanding and wished to proceed. Sterile technique was used throughout procedure. Epidural site was Dressed with sterile barrier dressing. No paresthesias, signs of intravascular injection Or signs of intrathecal spread were encountered.  Patient was more comfortable after the epidural was dosed. Please see RN's note for documentation of vital signs and FHR which are stable.

## 2013-02-14 NOTE — Progress Notes (Signed)
   Subjective: Pt doing well, contractions more uncomfortable but coping for now  Objective: BP 107/75  Pulse 78  Temp(Src) 98.4 F (36.9 C) (Oral)  Resp 18  Ht 5\' 1"  (1.549 m)  Wt 78.926 kg (174 lb)  BMI 32.89 kg/m2  LMP 05/18/2012      FHT:  FHR: 145 bpm, variability: moderate,  accelerations:  Present,  decelerations:  Absent UC:   regular, every 2-4 minutes SVE:   Dilation: 5 Effacement (%): 80 Station: -1 Exam by:: Lakeva Hollon AROM of forebag, scant fluid  Labs: Lab Results  Component Value Date   WBC 10.6* 02/13/2013   HGB 12.0 02/13/2013   HCT 35.5* 02/13/2013   MCV 82.6 02/13/2013   PLT 159 02/13/2013    Assessment / Plan: Augmentation of labor, progressing well Pt may try to go without epidural.    Huel Cote W 02/14/2013, 6:40 AM

## 2013-02-15 LAB — CBC
HCT: 33.8 % — ABNORMAL LOW (ref 36.0–46.0)
MCHC: 32.8 g/dL (ref 30.0–36.0)
MCV: 83 fL (ref 78.0–100.0)
Platelets: 153 10*3/uL (ref 150–400)
RDW: 14.5 % (ref 11.5–15.5)

## 2013-02-15 NOTE — Progress Notes (Signed)
Post Partum Day 1 Subjective: no complaints, up ad lib, voiding, tolerating PO and nl lochia, pain controlled  Objective: Blood pressure 99/71, pulse 70, temperature 98.3 F (36.8 C), temperature source Oral, resp. rate 18, height 5\' 1"  (1.549 m), weight 78.926 kg (174 lb), last menstrual period 05/18/2012, SpO2 100.00%, unknown if currently breastfeeding.  Physical Exam:  General: alert and no distress Lochia: appropriate Uterine Fundus: firm  Recent Labs  02/13/13 2250 02/15/13 0610  HGB 12.0 11.1*  HCT 35.5* 33.8*    Assessment/Plan: Plan for discharge tomorrow, Breastfeeding and Lactation consult.  Routine care.     LOS: 2 days   BOVARD,Shavonte Zhao 02/15/2013, 6:53 AM

## 2013-02-16 ENCOUNTER — Inpatient Hospital Stay (HOSPITAL_COMMUNITY): Admission: RE | Admit: 2013-02-16 | Payer: BC Managed Care – PPO | Source: Ambulatory Visit

## 2013-02-16 MED ORDER — PRENATAL MULTIVITAMIN CH: 1.0000 | ORAL_TABLET | Freq: Every day | ORAL | Status: AC

## 2013-02-16 MED ORDER — IBUPROFEN 800 MG PO TABS: 800.0000 mg | ORAL_TABLET | Freq: Three times a day (TID) | ORAL | Status: AC | PRN

## 2013-02-16 MED ORDER — OXYCODONE-ACETAMINOPHEN 5-325 MG PO TABS: 1.0000 | ORAL_TABLET | Freq: Four times a day (QID) | ORAL | Status: AC | PRN

## 2013-02-16 NOTE — Discharge Summary (Signed)
Obstetric Discharge Summary Reason for Admission: onset of labor Prenatal Procedures: none Intrapartum Procedures: spontaneous vaginal delivery Postpartum Procedures: none Complications-Operative and Postpartum: 2nd degree perineal laceration Hemoglobin  Date Value Range Status  02/15/2013 11.1* 12.0 - 15.0 g/dL Final     HCT  Date Value Range Status  02/15/2013 33.8* 36.0 - 46.0 % Final    Physical Exam:  General: alert and no distress Lochia: appropriate Uterine Fundus: firm  Discharge Diagnoses: Term Pregnancy-delivered  Discharge Information: Date: 02/16/2013 Activity: pelvic rest Diet: routine Medications: PNV, Ibuprofen and Percocet Condition: stable Instructions: refer to practice specific booklet Discharge to: home Follow-up Information   Follow up with BOVARD,Delaynie Stetzer, MD. Schedule an appointment as soon as possible for a visit in 6 weeks.   Contact information:   [address] [place] [address] [phone]      Considering IUD - Mirena or Nexplanon Newborn Data: Live born female  Birth Weight: 5 lb 11.5 oz (2595 g) APGAR: 9, 9  Home with mother.  BOVARD,Ferdie Bakken 02/16/2013, 8:47 AM

## 2013-02-16 NOTE — Progress Notes (Signed)
Post Partum Day 2 Subjective: no complaints, up ad lib, voiding, tolerating PO and nl lochia, pain controlled, ready to go home  Objective: Blood pressure 97/64, pulse 76, temperature 98.2 F (36.8 C), temperature source Oral, resp. rate 18, height 5\' 1"  (1.549 m), weight 78.926 kg (174 lb), last menstrual period 05/18/2012, SpO2 100.00%, unknown if currently breastfeeding.  Physical Exam:  General: alert and no distress Lochia: appropriate Uterine Fundus: firm   Recent Labs  02/13/13 2250 02/15/13 0610  HGB 12.0 11.1*  HCT 35.5* 33.8*    Assessment/Plan: Discharge home, Breastfeeding and Lactation consult.  D/c with motrin, percocet, pnv.  F/cu 6 weeks.     LOS: 3 days   BOVARD,Carly Petersen 02/16/2013, 8:43 AM
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
## 2014-06-24 ENCOUNTER — Encounter (HOSPITAL_COMMUNITY): Payer: Self-pay | Admitting: Anesthesiology

## 2018-06-19 ENCOUNTER — Emergency Department (HOSPITAL_COMMUNITY)
Admission: EM | Admit: 2018-06-19 | Discharge: 2018-06-19 | Disposition: A | Discharge status: Home / Self Care | Payer: Self-pay | Attending: Emergency Medicine | Admitting: Emergency Medicine

## 2018-06-19 ENCOUNTER — Encounter (HOSPITAL_COMMUNITY): Payer: Self-pay

## 2018-06-19 ENCOUNTER — Other Ambulatory Visit: Payer: Self-pay

## 2018-06-19 DIAGNOSIS — Z5321 Procedure and treatment not carried out due to patient leaving prior to being seen by health care provider: Secondary | ICD-10-CM | POA: Insufficient documentation

## 2018-06-19 DIAGNOSIS — R509 Fever, unspecified: Secondary | ICD-10-CM | POA: Insufficient documentation

## 2018-06-19 DIAGNOSIS — R5383 Other fatigue: Secondary | ICD-10-CM | POA: Insufficient documentation

## 2018-06-19 MED ORDER — ACETAMINOPHEN 325 MG PO TABS
650.0000 mg | ORAL_TABLET | Freq: Once | ORAL | Status: AC | PRN
  Administered 2018-06-19: 650 mg via ORAL
  Filled 2018-06-19: qty 2

## 2018-06-19 NOTE — ED Triage Notes (Signed)
Patient states she had a "cold" last week and has had a fever x 2 days. patient states she last took Tylenol yesterday.

## 2018-06-19 NOTE — ED Notes (Signed)
Per registration pt left
